# Patient Record
Sex: Male | Born: 1990 | Race: White | Hispanic: No | Marital: Single | Smoking: Current every day smoker
Health system: Southern US, Community
[De-identification: ages and names within clinical notes are randomized; demographics above are authoritative.]

## PROBLEM LIST (undated history)

## (undated) DIAGNOSIS — Z789 Other specified health status: Secondary | ICD-10-CM

## (undated) HISTORY — PX: JOINT REPLACEMENT: SHX530

---
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
## 2011-09-25 ENCOUNTER — Other Ambulatory Visit: Payer: Self-pay | Admitting: Otolaryngology

## 2011-09-25 LAB — COMPREHENSIVE METABOLIC PANEL
Albumin: 4.1 g/dL (ref 3.4–5.0)
Alkaline Phosphatase: 75 U/L (ref 50–136)
Anion Gap: 6 — ABNORMAL LOW (ref 7–16)
Calcium, Total: 9.2 mg/dL (ref 8.5–10.1)
Chloride: 106 mmol/L (ref 98–107)
Creatinine: 0.91 mg/dL (ref 0.60–1.30)
EGFR (African American): >60
EGFR (Non-African Amer.): >60
Osmolality: 280 (ref 275–301)
Potassium: 3.9 mmol/L (ref 3.5–5.1)
SGPT (ALT): 28 U/L
Total Protein: 7.2 g/dL (ref 6.4–8.2)

## 2011-09-25 LAB — CBC WITH DIFFERENTIAL/PLATELET
Basophil #: 0 10*3/uL (ref 0.0–0.1)
Basophil %: 0.8 %
Eosinophil #: 0.1 10*3/uL (ref 0.0–0.7)
HCT: 48.7 % (ref 40.0–52.0)
Lymphocyte #: 2 10*3/uL (ref 1.0–3.6)
MCH: 30 pg (ref 26.0–34.0)
MCHC: 33.3 g/dL (ref 32.0–36.0)
Monocyte #: 0.6 x10 3/mm (ref 0.2–1.0)
Monocyte %: 10.1 %
Neutrophil #: 2.9 10*3/uL (ref 1.4–6.5)
Neutrophil %: 52.7 %
RBC: 5.41 10*6/uL (ref 4.40–5.90)
RDW: 12.9 % (ref 11.5–14.5)

## 2011-09-27 ENCOUNTER — Ambulatory Visit: Payer: Self-pay | Admitting: Otolaryngology

## 2011-10-15 ENCOUNTER — Ambulatory Visit: Payer: Self-pay | Admitting: Family Medicine

## 2012-06-22 ENCOUNTER — Other Ambulatory Visit: Payer: Self-pay | Admitting: Orthopedic Surgery

## 2012-06-22 DIAGNOSIS — M25561 Pain in right knee: Secondary | ICD-10-CM

## 2012-06-24 ENCOUNTER — Ambulatory Visit
Admission: RE | Admit: 2012-06-24 | Discharge: 2012-06-24 | Disposition: A | Discharge status: Home / Self Care | Payer: BC Managed Care – PPO | Source: Ambulatory Visit | Attending: Orthopedic Surgery | Admitting: Orthopedic Surgery

## 2012-06-24 DIAGNOSIS — M25561 Pain in right knee: Secondary | ICD-10-CM

## 2012-06-24 DIAGNOSIS — M25461 Effusion, right knee: Secondary | ICD-10-CM

## 2013-10-03 ENCOUNTER — Other Ambulatory Visit: Payer: Self-pay | Admitting: Orthopedic Surgery

## 2013-10-03 DIAGNOSIS — R531 Weakness: Secondary | ICD-10-CM

## 2013-10-03 DIAGNOSIS — M25562 Pain in left knee: Secondary | ICD-10-CM

## 2013-10-03 DIAGNOSIS — R609 Edema, unspecified: Secondary | ICD-10-CM

## 2013-10-04 ENCOUNTER — Ambulatory Visit
Admission: RE | Admit: 2013-10-04 | Discharge: 2013-10-04 | Disposition: A | Discharge status: Home / Self Care | Payer: No Typology Code available for payment source | Source: Ambulatory Visit | Attending: Orthopedic Surgery | Admitting: Orthopedic Surgery

## 2013-10-04 ENCOUNTER — Other Ambulatory Visit: Payer: BC Managed Care – PPO

## 2013-10-04 DIAGNOSIS — R609 Edema, unspecified: Secondary | ICD-10-CM

## 2013-10-04 DIAGNOSIS — R531 Weakness: Secondary | ICD-10-CM

## 2013-10-04 DIAGNOSIS — M25562 Pain in left knee: Secondary | ICD-10-CM

## 2013-10-18 ENCOUNTER — Encounter (HOSPITAL_COMMUNITY): Payer: Self-pay | Admitting: Pharmacy Technician

## 2013-10-18 ENCOUNTER — Other Ambulatory Visit (HOSPITAL_COMMUNITY): Payer: Self-pay | Admitting: Orthopedic Surgery

## 2013-10-24 NOTE — Pre-Procedure Instructions (Signed)
Shane HammingDylan C Warren  10/24/2013   Your procedure is scheduled on:  10/27/13  [facility] AM.  Call this number if you have problems the morning of surgery: [phone]   Remember:   Do not eat food or drink liquids after midnight.   Take these medicines the morning of surgery with A SIP OF WATER: oxycodone   Do not wear jewelry, make-up or nail polish.  Do not wear lotions, powders, or perfumes. You may wear deodorant.  Do not shave 48 hours prior to surgery. Men may shave face and neck.  Do not bring valuables to the hospital.  [facility]                  for any belongings or valuables.               Contacts, dentures or bridgework may not be worn into surgery.  Leave suitcase in the car. After surgery it may be brought to your room.  For patients admitted to the hospital, discharge time is determined by your                treatment team.               Patients discharged the day of surgery will not be allowed to drive  home.  Name and phone number of your driver: family  Special Instructions: Shower using CHG 2 nights before surgery and the night before surgery.  If you shower the day of surgery use CHG.  Use special wash - you have one bottle of CHG for all showers.  You should use approximately 1/3 of the bottle for each shower.   Please read over the following fact sheets that you were given: Pain Booklet, Coughing and Deep Breathing and Surgical Site Infection Prevention

## 2013-10-25 ENCOUNTER — Encounter (HOSPITAL_COMMUNITY): Payer: Self-pay

## 2013-10-25 ENCOUNTER — Encounter (HOSPITAL_COMMUNITY)
Admission: RE | Admit: 2013-10-25 | Discharge: 2013-10-25 | Disposition: A | Discharge status: Home / Self Care | Payer: BC Managed Care – PPO | Source: Ambulatory Visit | Attending: Orthopedic Surgery | Admitting: Orthopedic Surgery

## 2013-10-25 HISTORY — DX: Other specified health status: Z78.9

## 2013-10-25 LAB — CBC
HEMATOCRIT: 49.2 % (ref 39.0–52.0)
HEMOGLOBIN: 17.4 g/dL — AB (ref 13.0–17.0)
MCH: 30.6 pg (ref 26.0–34.0)
MCHC: 35.4 g/dL (ref 30.0–36.0)
MCV: 86.5 fL (ref 78.0–100.0)
Platelets: 186 10*3/uL (ref 150–400)
RBC: 5.69 MIL/uL (ref 4.22–5.81)
RDW: 12.7 % (ref 11.5–15.5)
WBC: 7.6 10*3/uL (ref 4.0–10.5)

## 2013-10-27 ENCOUNTER — Encounter (HOSPITAL_COMMUNITY): Payer: Self-pay | Admitting: Anesthesiology

## 2013-10-27 ENCOUNTER — Ambulatory Visit (HOSPITAL_COMMUNITY): Payer: BC Managed Care – PPO

## 2013-10-27 ENCOUNTER — Encounter (HOSPITAL_COMMUNITY)
Admission: RE | Disposition: A | Discharge status: Home / Self Care | Payer: Self-pay | Source: Ambulatory Visit | Attending: Orthopedic Surgery

## 2013-10-27 ENCOUNTER — Ambulatory Visit (HOSPITAL_COMMUNITY): Payer: BC Managed Care – PPO | Admitting: Anesthesiology

## 2013-10-27 ENCOUNTER — Observation Stay (HOSPITAL_COMMUNITY)
Admission: RE | Admit: 2013-10-27 | Discharge: 2013-10-28 | Disposition: A | Discharge status: Home / Self Care | Payer: BC Managed Care – PPO | Source: Ambulatory Visit | Attending: Orthopedic Surgery | Admitting: Orthopedic Surgery

## 2013-10-27 ENCOUNTER — Encounter (HOSPITAL_COMMUNITY): Payer: BC Managed Care – PPO | Admitting: Anesthesiology

## 2013-10-27 DIAGNOSIS — F17200 Nicotine dependence, unspecified, uncomplicated: Secondary | ICD-10-CM | POA: Insufficient documentation

## 2013-10-27 DIAGNOSIS — X58XXXA Exposure to other specified factors, initial encounter: Secondary | ICD-10-CM | POA: Insufficient documentation

## 2013-10-27 DIAGNOSIS — S83006A Unspecified dislocation of unspecified patella, initial encounter: Principal | ICD-10-CM | POA: Insufficient documentation

## 2013-10-27 DIAGNOSIS — M25362 Other instability, left knee: Secondary | ICD-10-CM

## 2013-10-27 DIAGNOSIS — Y9367 Activity, basketball: Secondary | ICD-10-CM | POA: Insufficient documentation

## 2013-10-27 DIAGNOSIS — Z5333 Arthroscopic surgical procedure converted to open procedure: Secondary | ICD-10-CM | POA: Insufficient documentation

## 2013-10-27 DIAGNOSIS — M2240 Chondromalacia patellae, unspecified knee: Secondary | ICD-10-CM | POA: Insufficient documentation

## 2013-10-27 DIAGNOSIS — Z01812 Encounter for preprocedural laboratory examination: Secondary | ICD-10-CM | POA: Insufficient documentation

## 2013-10-27 DIAGNOSIS — M25369 Other instability, unspecified knee: Secondary | ICD-10-CM | POA: Diagnosis present

## 2013-10-27 DIAGNOSIS — M2340 Loose body in knee, unspecified knee: Secondary | ICD-10-CM | POA: Insufficient documentation

## 2013-10-27 HISTORY — PX: KNEE ARTHROSCOPY WITH MEDIAL PATELLAR FEMORAL LIGAMENT RECONSTRUCTION: SHX5652

## 2013-10-27 SURGERY — REPAIR, TENDON, PATELLAR, ARTHROSCOPIC
Anesthesia: General | Site: Knee | Laterality: Left

## 2013-10-27 MED ORDER — CEFAZOLIN SODIUM-DEXTROSE 2-3 GM-% IV SOLR
2.0000 g | INTRAVENOUS | Status: AC
  Administered 2013-10-27: 2 g via INTRAVENOUS

## 2013-10-27 MED ORDER — METHOCARBAMOL 1000 MG/10ML IJ SOLN
500.0000 mg | Freq: Four times a day (QID) | INTRAMUSCULAR | Status: DC | PRN
  Administered 2013-10-27: 500 mg via INTRAVENOUS
  Filled 2013-10-27: qty 5

## 2013-10-27 MED ORDER — BUPIVACAINE HCL (PF) 0.25 % IJ SOLN
INTRAMUSCULAR | Status: AC
  Filled 2013-10-27: qty 30

## 2013-10-27 MED ORDER — ROCURONIUM BROMIDE 50 MG/5ML IV SOLN
INTRAVENOUS | Status: AC
  Filled 2013-10-27: qty 1

## 2013-10-27 MED ORDER — HYDROMORPHONE HCL PF 1 MG/ML IJ SOLN
INTRAMUSCULAR | Status: AC
  Administered 2013-10-27: 0.5 mg via INTRAVENOUS
  Filled 2013-10-27: qty 1

## 2013-10-27 MED ORDER — EVICEL 2 ML EX KIT
PACK | CUTANEOUS | Status: AC
  Filled 2013-10-27: qty 1

## 2013-10-27 MED ORDER — LIDOCAINE HCL (CARDIAC) 20 MG/ML IV SOLN
INTRAVENOUS | Status: DC | PRN
  Administered 2013-10-27: 50 mg via INTRAVENOUS

## 2013-10-27 MED ORDER — SCOPOLAMINE 1 MG/3DAYS TD PT72
MEDICATED_PATCH | TRANSDERMAL | Status: DC | PRN
  Administered 2013-10-27: 1 via TRANSDERMAL

## 2013-10-27 MED ORDER — PROPOFOL 10 MG/ML IV BOLUS
INTRAVENOUS | Status: DC | PRN
  Administered 2013-10-27: 300 mg via INTRAVENOUS

## 2013-10-27 MED ORDER — ONDANSETRON HCL 4 MG/2ML IJ SOLN
INTRAMUSCULAR | Status: DC | PRN
  Administered 2013-10-27: 4 mg via INTRAVENOUS

## 2013-10-27 MED ORDER — MIDAZOLAM HCL 2 MG/2ML IJ SOLN
INTRAMUSCULAR | Status: AC
  Filled 2013-10-27: qty 2

## 2013-10-27 MED ORDER — HYDROMORPHONE HCL PF 1 MG/ML IJ SOLN
0.5000 mg | INTRAMUSCULAR | Status: DC | PRN
Start: 2013-10-27 — End: 2013-10-28
  Administered 2013-10-27: 1 mg via INTRAVENOUS
  Filled 2013-10-27: qty 1

## 2013-10-27 MED ORDER — LIDOCAINE HCL (CARDIAC) 20 MG/ML IV SOLN
INTRAVENOUS | Status: AC
  Filled 2013-10-27: qty 5

## 2013-10-27 MED ORDER — PROPOFOL 10 MG/ML IV BOLUS
INTRAVENOUS | Status: AC
  Filled 2013-10-27: qty 20

## 2013-10-27 MED ORDER — METHOCARBAMOL 500 MG PO TABS
500.0000 mg | ORAL_TABLET | Freq: Four times a day (QID) | ORAL | Status: DC | PRN
  Administered 2013-10-27 – 2013-10-28 (×3): 500 mg via ORAL
  Filled 2013-10-27 (×4): qty 1

## 2013-10-27 MED ORDER — ONDANSETRON HCL 4 MG/2ML IJ SOLN
INTRAMUSCULAR | Status: AC
  Filled 2013-10-27: qty 2

## 2013-10-27 MED ORDER — 0.9 % SODIUM CHLORIDE (POUR BTL) OPTIME
TOPICAL | Status: DC | PRN
  Administered 2013-10-27: 1000 mL

## 2013-10-27 MED ORDER — ASPIRIN 325 MG PO TABS
325.0000 mg | ORAL_TABLET | Freq: Every day | ORAL | Status: DC
  Administered 2013-10-27 – 2013-10-28 (×2): 325 mg via ORAL
  Filled 2013-10-27 (×2): qty 1

## 2013-10-27 MED ORDER — METOCLOPRAMIDE HCL 5 MG/ML IJ SOLN: 5.0000 mg | Freq: Three times a day (TID) | INTRAMUSCULAR | Status: DC | PRN

## 2013-10-27 MED ORDER — DEXAMETHASONE SODIUM PHOSPHATE 4 MG/ML IJ SOLN
INTRAMUSCULAR | Status: DC | PRN
  Administered 2013-10-27: 4 mg via INTRAVENOUS

## 2013-10-27 MED ORDER — FENTANYL CITRATE 0.05 MG/ML IJ SOLN
INTRAMUSCULAR | Status: AC
  Filled 2013-10-27: qty 5

## 2013-10-27 MED ORDER — MORPHINE SULFATE 4 MG/ML IJ SOLN
INTRAMUSCULAR | Status: AC
  Filled 2013-10-27: qty 1

## 2013-10-27 MED ORDER — HYDROMORPHONE HCL PF 1 MG/ML IJ SOLN
INTRAMUSCULAR | Status: AC
  Filled 2013-10-27: qty 1

## 2013-10-27 MED ORDER — ARTIFICIAL TEARS OP OINT
TOPICAL_OINTMENT | OPHTHALMIC | Status: AC
  Filled 2013-10-27: qty 3.5

## 2013-10-27 MED ORDER — SCOPOLAMINE 1 MG/3DAYS TD PT72
MEDICATED_PATCH | TRANSDERMAL | Status: AC
  Filled 2013-10-27: qty 1

## 2013-10-27 MED ORDER — SUCCINYLCHOLINE CHLORIDE 20 MG/ML IJ SOLN
INTRAMUSCULAR | Status: AC
  Filled 2013-10-27: qty 1

## 2013-10-27 MED ORDER — CEFAZOLIN SODIUM-DEXTROSE 2-3 GM-% IV SOLR
INTRAVENOUS | Status: AC
  Filled 2013-10-27: qty 50

## 2013-10-27 MED ORDER — EPHEDRINE SULFATE 50 MG/ML IJ SOLN
INTRAMUSCULAR | Status: AC
  Filled 2013-10-27: qty 1

## 2013-10-27 MED ORDER — KETOROLAC TROMETHAMINE 30 MG/ML IJ SOLN
INTRAMUSCULAR | Status: AC
  Filled 2013-10-27: qty 1

## 2013-10-27 MED ORDER — OXYCODONE HCL 5 MG PO TABS
5.0000 mg | ORAL_TABLET | ORAL | Status: DC | PRN
Start: 2013-10-27 — End: 2013-10-28
  Administered 2013-10-27 – 2013-10-28 (×5): 10 mg via ORAL
  Filled 2013-10-27 (×5): qty 2

## 2013-10-27 MED ORDER — CHLORHEXIDINE GLUCONATE 4 % EX LIQD
60.0000 mL | Freq: Once | CUTANEOUS | Status: DC
  Filled 2013-10-27: qty 60

## 2013-10-27 MED ORDER — METOCLOPRAMIDE HCL 10 MG PO TABS: 5.0000 mg | ORAL_TABLET | Freq: Three times a day (TID) | ORAL | Status: DC | PRN

## 2013-10-27 MED ORDER — HYDROMORPHONE HCL PF 1 MG/ML IJ SOLN
0.2500 mg | INTRAMUSCULAR | Status: DC | PRN
  Administered 2013-10-27 (×5): 0.5 mg via INTRAVENOUS

## 2013-10-27 MED ORDER — LACTATED RINGERS IV SOLN
INTRAVENOUS | Status: DC | PRN
  Administered 2013-10-27 (×2): via INTRAVENOUS

## 2013-10-27 MED ORDER — DEXAMETHASONE SODIUM PHOSPHATE 4 MG/ML IJ SOLN
INTRAMUSCULAR | Status: AC
  Filled 2013-10-27: qty 1

## 2013-10-27 MED ORDER — SODIUM CHLORIDE 0.9 % IR SOLN
Status: DC | PRN
Start: 2013-10-27 — End: 2013-10-27
  Administered 2013-10-27 (×2): 3000 mL

## 2013-10-27 MED ORDER — KETOROLAC TROMETHAMINE 15 MG/ML IJ SOLN
15.0000 mg | Freq: Four times a day (QID) | INTRAMUSCULAR | Status: DC
  Administered 2013-10-27 – 2013-10-28 (×4): 15 mg via INTRAVENOUS
  Filled 2013-10-27 (×7): qty 1

## 2013-10-27 MED ORDER — POTASSIUM CHLORIDE IN NACL 20-0.9 MEQ/L-% IV SOLN
INTRAVENOUS | Status: AC
  Administered 2013-10-27 – 2013-10-28 (×2): via INTRAVENOUS
  Filled 2013-10-27 (×2): qty 1000

## 2013-10-27 MED ORDER — ONDANSETRON HCL 4 MG PO TABS: 4.0000 mg | ORAL_TABLET | Freq: Four times a day (QID) | ORAL | Status: DC | PRN

## 2013-10-27 MED ORDER — MORPHINE SULFATE 4 MG/ML IJ SOLN
INTRAMUSCULAR | Status: DC | PRN
  Administered 2013-10-27 (×2): 4 mg via SUBCUTANEOUS

## 2013-10-27 MED ORDER — KETOROLAC TROMETHAMINE 30 MG/ML IJ SOLN
INTRAMUSCULAR | Status: DC | PRN
  Administered 2013-10-27: 30 mg via INTRAVENOUS

## 2013-10-27 MED ORDER — BUPIVACAINE HCL (PF) 0.25 % IJ SOLN
INTRAMUSCULAR | Status: DC | PRN
  Administered 2013-10-27: 30 mL

## 2013-10-27 MED ORDER — CEFAZOLIN SODIUM-DEXTROSE 2-3 GM-% IV SOLR
2.0000 g | Freq: Four times a day (QID) | INTRAVENOUS | Status: AC
  Administered 2013-10-27 (×2): 2 g via INTRAVENOUS
  Filled 2013-10-27 (×2): qty 50

## 2013-10-27 MED ORDER — CLONIDINE HCL (ANALGESIA) 100 MCG/ML EP SOLN
EPIDURAL | Status: DC | PRN
Start: 2013-10-27 — End: 2013-10-27
  Administered 2013-10-27: 100 ug via INTRA_ARTICULAR

## 2013-10-27 MED ORDER — MIDAZOLAM HCL 5 MG/5ML IJ SOLN
INTRAMUSCULAR | Status: DC | PRN
  Administered 2013-10-27 (×2): 2 mg via INTRAVENOUS

## 2013-10-27 MED ORDER — STERILE WATER FOR INJECTION IJ SOLN
INTRAMUSCULAR | Status: AC
  Filled 2013-10-27: qty 10

## 2013-10-27 MED ORDER — OXYCODONE HCL 5 MG/5ML PO SOLN: 5.0000 mg | Freq: Once | ORAL | Status: AC | PRN

## 2013-10-27 MED ORDER — METOCLOPRAMIDE HCL 5 MG/ML IJ SOLN: 10.0000 mg | Freq: Once | INTRAMUSCULAR | Status: DC | PRN

## 2013-10-27 MED ORDER — OXYCODONE HCL 5 MG PO TABS
5.0000 mg | ORAL_TABLET | Freq: Once | ORAL | Status: AC | PRN
  Administered 2013-10-27: 5 mg via ORAL

## 2013-10-27 MED ORDER — OXYCODONE HCL 5 MG PO TABS
ORAL_TABLET | ORAL | Status: AC
  Administered 2013-10-27: 5 mg via ORAL
  Filled 2013-10-27: qty 1

## 2013-10-27 MED ORDER — FENTANYL CITRATE 0.05 MG/ML IJ SOLN
INTRAMUSCULAR | Status: DC | PRN
  Administered 2013-10-27 (×3): 50 ug via INTRAVENOUS
  Administered 2013-10-27: 25 ug via INTRAVENOUS
  Administered 2013-10-27: 50 ug via INTRAVENOUS
  Administered 2013-10-27 (×6): 25 ug via INTRAVENOUS
  Administered 2013-10-27 (×2): 50 ug via INTRAVENOUS
  Administered 2013-10-27: 25 ug via INTRAVENOUS

## 2013-10-27 MED ORDER — CHLORHEXIDINE GLUCONATE 4 % EX LIQD
60.0000 mL | Freq: Once | CUTANEOUS | Status: DC
Start: 2013-10-27 — End: 2013-10-27
  Filled 2013-10-27: qty 60

## 2013-10-27 MED ORDER — ONDANSETRON HCL 4 MG/2ML IJ SOLN
4.0000 mg | Freq: Four times a day (QID) | INTRAMUSCULAR | Status: DC | PRN
  Administered 2013-10-27 – 2013-10-28 (×2): 4 mg via INTRAVENOUS
  Filled 2013-10-27 (×2): qty 2

## 2013-10-27 SURGICAL SUPPLY — 80 items
BANDAGE ELASTIC 6 VELCRO ST LF (GAUZE/BANDAGES/DRESSINGS) ×3 IMPLANT
BANDAGE ESMARK 6X9 LF (GAUZE/BANDAGES/DRESSINGS) ×1 IMPLANT
BLADE CUDA 5.5 (BLADE) IMPLANT
BLADE GREAT WHITE 4.2 (BLADE) ×2 IMPLANT
BLADE GREAT WHITE 4.2MM (BLADE) ×1
BLADE SURG 10 STRL SS (BLADE) ×3 IMPLANT
BLADE SURG 15 STRL LF DISP TIS (BLADE) ×2 IMPLANT
BLADE SURG 15 STRL SS (BLADE) ×4
BNDG ESMARK 6X9 LF (GAUZE/BANDAGES/DRESSINGS) ×3
BUR OVAL 6.0 (BURR) IMPLANT
CLOSURE STERI-STRIP 1/2X4 (GAUZE/BANDAGES/DRESSINGS) ×2
CLSR STERI-STRIP ANTIMIC 1/2X4 (GAUZE/BANDAGES/DRESSINGS) ×4 IMPLANT
COVER SURGICAL LIGHT HANDLE (MISCELLANEOUS) ×3 IMPLANT
CUFF TOURNIQUET SINGLE 34IN LL (TOURNIQUET CUFF) ×3 IMPLANT
CUFF TOURNIQUET SINGLE 44IN (TOURNIQUET CUFF) IMPLANT
DECANTER SPIKE VIAL GLASS SM (MISCELLANEOUS) ×3 IMPLANT
DRAPE ARTHROSCOPY W/POUCH 114 (DRAPES) ×3 IMPLANT
DRAPE C-ARM 42X72 X-RAY (DRAPES) ×3 IMPLANT
DRAPE C-ARMOR (DRAPES) ×3 IMPLANT
DRAPE INCISE IOBAN 66X45 STRL (DRAPES) ×6 IMPLANT
DRAPE U-SHAPE 47X51 STRL (DRAPES) ×3 IMPLANT
ELECT REM PT RETURN 9FT ADLT (ELECTROSURGICAL) ×3
ELECTRODE REM PT RTRN 9FT ADLT (ELECTROSURGICAL) ×1 IMPLANT
EVACUATOR 1/8 PVC DRAIN (DRAIN) ×3 IMPLANT
GLOVE BIO SURGEON ST LM GN SZ9 (GLOVE) IMPLANT
GLOVE BIOGEL PI IND STRL 8 (GLOVE) ×1 IMPLANT
GLOVE BIOGEL PI IND STRL 9 (GLOVE) IMPLANT
GLOVE BIOGEL PI INDICATOR 8 (GLOVE) ×2
GLOVE BIOGEL PI INDICATOR 9 (GLOVE)
GLOVE SURG ORTHO 8.0 STRL STRW (GLOVE) ×3 IMPLANT
GOWN STRL REUS W/ TWL LRG LVL3 (GOWN DISPOSABLE) ×3 IMPLANT
GOWN STRL REUS W/ TWL XL LVL3 (GOWN DISPOSABLE) ×1 IMPLANT
GOWN STRL REUS W/TWL LRG LVL3 (GOWN DISPOSABLE) ×6
GOWN STRL REUS W/TWL XL LVL3 (GOWN DISPOSABLE) ×2
IMMOBILIZER KNEE 22 (SOFTGOODS) ×3 IMPLANT
KIT BASIN OR (CUSTOM PROCEDURE TRAY) ×3 IMPLANT
KIT BIO-TENODESIS 3X8 DISP (MISCELLANEOUS) ×2
KIT INSRT BABSR STRL DISP BTN (MISCELLANEOUS) ×1 IMPLANT
KIT ROOM TURNOVER OR (KITS) ×3 IMPLANT
KIT TRANSTIBIAL (DISPOSABLE) IMPLANT
MANIFOLD NEPTUNE II (INSTRUMENTS) ×3 IMPLANT
NEEDLE 18GX1X1/2 (RX/OR ONLY) (NEEDLE) ×3 IMPLANT
NS IRRIG 1000ML POUR BTL (IV SOLUTION) ×3 IMPLANT
PACK ARTHROSCOPY DSU (CUSTOM PROCEDURE TRAY) ×3 IMPLANT
PACK IMPLANT BIOCOMPOSITE MPFL (Orthopedic Implant) ×3 IMPLANT
PAD ABD 8X10 STRL (GAUZE/BANDAGES/DRESSINGS) ×3 IMPLANT
PAD ARMBOARD 7.5X6 YLW CONV (MISCELLANEOUS) ×6 IMPLANT
PADDING CAST COTTON 6X4 STRL (CAST SUPPLIES) ×3 IMPLANT
PASSER SUT SWANSON 36MM LOOP (INSTRUMENTS) ×3 IMPLANT
PENCIL BUTTON HOLSTER BLD 10FT (ELECTRODE) ×3 IMPLANT
PLUERISTICK MICROFX CASE (MISCELLANEOUS) ×3 IMPLANT
SET ARTHROSCOPY TUBING (MISCELLANEOUS) ×2
SET ARTHROSCOPY TUBING LN (MISCELLANEOUS) ×1 IMPLANT
SPONGE GAUZE 4X4 12PLY STER LF (GAUZE/BANDAGES/DRESSINGS) ×3 IMPLANT
SPONGE LAP 18X18 X RAY DECT (DISPOSABLE) ×9 IMPLANT
SPONGE LAP 4X18 X RAY DECT (DISPOSABLE) ×6 IMPLANT
SPONGE SCRUB IODOPHOR (GAUZE/BANDAGES/DRESSINGS) ×3 IMPLANT
SUCTION FRAZIER TIP 10 FR DISP (SUCTIONS) ×3 IMPLANT
SUT 2 FIBERLOOP 20 STRT BLUE (SUTURE) ×3
SUT ETHILON 3 0 PS 1 (SUTURE) ×3 IMPLANT
SUT FIBERWIRE #2 38 T-5 BLUE (SUTURE)
SUT PROLENE 3 0 PS 2 (SUTURE) ×12 IMPLANT
SUT VIC AB 0 CT1 27 (SUTURE) ×8
SUT VIC AB 0 CT1 27XBRD ANBCTR (SUTURE) ×4 IMPLANT
SUT VIC AB 1 CT1 27 (SUTURE) ×2
SUT VIC AB 1 CT1 27XBRD ANBCTR (SUTURE) ×1 IMPLANT
SUT VIC AB 2-0 CT1 27 (SUTURE) ×8
SUT VIC AB 2-0 CT1 TAPERPNT 27 (SUTURE) ×4 IMPLANT
SUTURE 2 FIBERLOOP 20 STRT BLU (SUTURE) ×1 IMPLANT
SUTURE FIBERWR #2 38 T-5 BLUE (SUTURE) IMPLANT
SYR 30ML LL (SYRINGE) ×3 IMPLANT
SYR BULB IRRIGATION 50ML (SYRINGE) ×3 IMPLANT
SYR TB 1ML LUER SLIP (SYRINGE) ×3 IMPLANT
THUMB TAB DISPOSABLE (MISCELLANEOUS) ×3 IMPLANT
TOWEL OR 17X24 6PK STRL BLUE (TOWEL DISPOSABLE) ×3 IMPLANT
TOWEL OR 17X26 10 PK STRL BLUE (TOWEL DISPOSABLE) ×3 IMPLANT
UNDERPAD 30X30 INCONTINENT (UNDERPADS AND DIAPERS) ×3 IMPLANT
WATER STERILE IRR 1000ML POUR (IV SOLUTION) ×3 IMPLANT
WRAP KNEE MAXI GEL POST OP (GAUZE/BANDAGES/DRESSINGS) ×3 IMPLANT
YANKAUER SUCT BULB TIP NO VENT (SUCTIONS) ×3 IMPLANT

## 2013-10-27 NOTE — Evaluation (Signed)
Physical Therapy Evaluation Patient Details Name: Shane Warren MRN: [id number] DOB: [birth year] Today's Date: 10/27/2013   History of Present Illness  23 y.o. male s/p KNEE ARTHROSCOPY WITH MEDIAL PATELLAR FEMORAL LIGAMENT RECONSTRUCTION REVISION, CHONDRAL DEFECT REPAIR  Clinical Impression  Patient is seen following the above procedure and presents with functional limitations due to the deficits listed below (see PT Problem List). Gait training and stair training limited by nausea this afternoon. Will follow up in AM for appropriate training prior to D/c. Pt reports numbness over left saphenous nerve distribution distal to knee. Patient will benefit from skilled PT to increase their independence and safety with mobility to allow discharge to the venue listed below.       Follow Up Recommendations Outpatient PT    Equipment Recommendations  Crutches    Recommendations for Other Services OT consult     Precautions / Restrictions Precautions Precautions: Knee Precaution Comments: reviewed knee precautions Required Braces or Orthoses: Knee Immobilizer - Left Restrictions Weight Bearing Restrictions: Yes LLE Weight Bearing: Weight bearing as tolerated      Mobility  Bed Mobility Overal bed mobility: Modified Independent             General bed mobility comments: No cues needed. Pt safely performs supine<>sit x2.  Transfers Overall transfer level: Needs assistance Equipment used: Crutches Transfers: Sit to/from Stand Sit to Stand: Min guard         General transfer comment: Min guard for safety with instruction for safe crutch placement during transition from sit<>stand.  Ambulation/Gait Ambulation/Gait assistance: Min guard Ambulation Distance (Feet): 5 Feet Assistive device: Crutches Gait Pattern/deviations: Step-to pattern (3 point gait.)   Gait velocity interpretation: Below normal speed for age/gender General Gait Details: No loss of balance. Demonstrates good  control of crutches. Pt with nausea, limiting ability to ambulate further. Educated on safe use of crutches.  Stairs            Wheelchair Mobility    Modified Rankin (Stroke Patients Only)       Balance Overall balance assessment: Needs assistance Sitting-balance support: No upper extremity supported;Feet supported Sitting balance-Leahy Scale: Good     Standing balance support: No upper extremity supported Standing balance-Leahy Scale: Fair                               Pertinent Vitals/Pain Pt reports nausea is greater than pain at this time Nurse notified Patient repositioned in chair for comfort.     Home Living Family/patient expects to be discharged to:: Private residence Living Arrangements: Parent Available Help at Discharge: Family;Available 24 hours/day Type of Home: House Home Access: Stairs to enter Entrance Stairs-Rails: None Entrance Stairs-Number of Steps: 5 Home Layout: Two level;Able to live on main level with bedroom/bathroom Home Equipment: Walker - 2 wheels      Prior Function Level of Independence: Independent               Hand Dominance   Dominant Hand: Right    Extremity/Trunk Assessment   Upper Extremity Assessment: Defer to OT evaluation           Lower Extremity Assessment: LLE deficits/detail   LLE Deficits / Details: decreased strength and ROM as expected post-op     Communication   Communication: No difficulties  Cognition Arousal/Alertness: Awake/alert Behavior During Therapy: WFL for tasks assessed/performed Overall Cognitive Status: Within Functional Limits for tasks assessed  General Comments General comments (skin integrity, edema, etc.): Decreased light touch sensation in left saphenous nerve distribution.Pt with severe nausea (-) emesis. Unable to participate in further gait or stair training this afternoon due to this. Nurse notified.    Exercises         Assessment/Plan    PT Assessment Patient needs continued PT services  PT Diagnosis Difficulty walking;Abnormality of gait;Acute pain   PT Problem List Decreased strength;Decreased range of motion;Decreased activity tolerance;Decreased balance;Decreased mobility;Decreased knowledge of use of DME;Impaired sensation;Pain  PT Treatment Interventions DME instruction;Gait training;Stair training;Functional mobility training;Therapeutic activities;Therapeutic exercise;Balance training;Neuromuscular re-education;Patient/family education;Modalities   PT Goals (Current goals can be found in the Care Plan section) Acute Rehab PT Goals Patient Stated Goal: none stated PT Goal Formulation: With patient Time For Goal Achievement: 11/03/13 Potential to Achieve Goals: Good    Frequency Min 5X/week   Barriers to discharge        Co-evaluation               End of Session Equipment Utilized During Treatment: Left knee immobilizer Activity Tolerance: Other (comment) (Limited by nausea) Patient left: in chair;with call bell/phone within reach;with family/visitor present Nurse Communication: Mobility status;Other (comment) (Nausea)    Functional Assessment Tool Used: Clinical observation Functional Limitation: Mobility: Walking and moving around Mobility: Walking and Moving Around Current Status [phone](G8978): At least 1 percent but less than 20 percent impaired, limited or restricted Mobility: Walking and Moving Around Goal Status [phone](G8979): At least 1 percent but less than 20 percent impaired, limited or restricted    Time: [phone] PT Time Calculation (min): 35 min   Charges:   PT Evaluation $Initial PT Evaluation Tier I: 1 Procedure PT Treatments $Therapeutic Activity: 8-22 mins $Self Care/Home Management: 8-22   PT G Codes:   Functional Assessment Tool Used: Clinical observation Functional Limitation: Mobility: Walking and moving around   [facility] [place],  [place] [phone]   [place] Mount[place], Lynzy Rawles S 10/27/2013, 5:11 PM

## 2013-10-27 NOTE — Progress Notes (Signed)
Orders for concent not signed.  Message left for Dr August Saucerean on his cell phone.

## 2013-10-27 NOTE — Brief Op Note (Signed)
10/27/2013  11:01 AM  PATIENT:  Denali C Douse  23 y.o. male  PRE-OPERATIVE DIAGNOSIS:  LEFT KNEE INSTABILITY, CHONDRAL DEFECT PATELLA  POST-OPERATIVE DIAGNOSIS:  LEFT KNEE INSTABILITY, CHONDRAL DEFECT PATELLA  PROCEDURE:  Procedure(s): KNEE ARTHROSCOPY WITH MEDIAL PATELLAR FEMORAL LIGAMENT RECONSTRUCTION REVISION, CHONDRAL DEFECT microfracture, removal of loose bodies  SURGEON:  Surgeon(s): Cammy CopaGregory Scott Keegan Bensch, MD  ASSISTANT: carla bethune  ANESTHESIA:   general  EBL: 50 ml    Total I/O In: 1500 [I.V.:1500] Out: 150 [Blood:150]  BLOOD ADMINISTERED: none  DRAINS: none   LOCAL MEDICATIONS USED: marcaine morphine clonidine  SPECIMEN:  No Specimen  COUNTS:  YES  TOURNIQUET:   Total Tourniquet Time Documented: Thigh (Left) - 125 minutes Total: Thigh (Left) - 125 minutes   DICTATION: .Other Dictation: Dictation Number [id number]  PLAN OF CARE: Admit for overnight observation  PATIENT DISPOSITION:  PACU - hemodynamically stable

## 2013-10-27 NOTE — H&P (Signed)
Shane Warren is an 23 y.o. male.   Chief Complaint: Left knee pain HPI: Shane Warren is a 23 year old patient is now about 7 years out from left knee medial patellofemoral ligament repair. He had patellar instability at that time and was doing well until a recent basketball injury. Essentially has repeat dislocation as a result of that injury several weeks ago. MRI scan consistent with disruption of the medial patellofemoral ligament repair he also has chondral injury on the patella. Reports continued pain and instability despite bracing and nonoperative management and presents now for operative management of the problem. He denies a family history of DVT or pulmonary embolism  Past Medical History  Diagnosis Date  . Medical history non-contributory     Past Surgical History  Procedure Laterality Date  . Joint replacement      bil knee surgery    No family history on file. Social History:  reports that he has been smoking Cigars.  He does not have any smokeless tobacco history on file. He reports that he drinks alcohol. He reports that he uses illicit drugs.  Allergies: No Known Allergies  Medications Prior to Admission  Medication Sig Dispense Refill  . oxyCODONE-acetaminophen (PERCOCET/ROXICET) 5-325 MG per tablet Take 1 tablet by mouth every 4 (four) hours as needed for severe pain.        Results for orders placed during the hospital encounter of 10/25/13 (from the past 48 hour(s))  CBC     Status: Abnormal   Collection Time    10/25/13  8:23 AM      Result Value Ref Range   WBC 7.6  4.0 - 10.5 K/uL   RBC 5.69  4.22 - 5.81 MIL/uL   Hemoglobin 17.4 (*) 13.0 - 17.0 g/dL   HCT 16.1  09.6 - 04.5 %   MCV 86.5  78.0 - 100.0 fL   MCH 30.6  26.0 - 34.0 pg   MCHC 35.4  30.0 - 36.0 g/dL   RDW 40.9  81.1 - 91.4 %   Platelets 186  150 - 400 K/uL   No results found.  Review of Systems  Constitutional: Negative.   HENT: Negative.   Eyes: Negative.   Respiratory: Negative.    Cardiovascular: Negative.   Gastrointestinal: Negative.   Genitourinary: Negative.   Musculoskeletal: Positive for joint pain.  Skin: Negative.   Neurological: Negative.   Endo/Heme/Allergies: Negative.   Psychiatric/Behavioral: Negative.     Blood pressure 95/46, pulse 77, temperature 97.6 F (36.4 C), temperature source Oral, resp. rate 15, height 5\' 11"  (1.803 m), weight 99.026 kg (218 lb 5 oz), SpO2 100.00%. Physical Exam  Constitutional: He appears well-developed.  HENT:  Head: Normocephalic.  Eyes: Pupils are equal, round, and reactive to light.  Neck: Normal range of motion.  Cardiovascular: Normal rate.   Respiratory: Effort normal.  Neurological: He is alert.  Skin: Skin is warm.  Psychiatric: He has a normal mood and affect.   examination the left knee demonstrates well-healed surgical incision on the medial aspect of the medial epicondyles on the medial aspect of the patella the anterior cruciate ligament PCL intact patella femoral crepitus is present collateral cruciates are stable range of motion is intact patellar apprehension is positive on the left-hand side as about 1 patella with lateral instability.  Assessment/Plan Impression is recurrent left knee lateral patellar instability status post medial patellofemoral ligament repair plan medial patellofemoral ligament reconstruction using hamstring autograft along with allograft chondral implantation into the defect of the patella  risk and benefits discussed with patient we will and to knee stiffness failure of the biological resurfacing of the patella to take place as well as potential for recurrent instability or overt pressuring of the patellofemoral joint risk and benefits are discussed all questions answered.  DEAN,GREGORY SCOTT 10/27/2013, 7:22 AM

## 2013-10-27 NOTE — Anesthesia Procedure Notes (Signed)
Procedure Name: LMA Insertion Date/Time: 10/27/2013 7:39 AM Performed by: Margaree MackintoshYACOUB, [name] Pre-anesthesia Checklist: Patient identified, Emergency Drugs available, Suction available, Patient being monitored and Timeout performed Patient Re-evaluated:Patient Re-evaluated prior to inductionOxygen Delivery Method: Circle system utilized Preoxygenation: Pre-oxygenation with 100% oxygen Intubation Type: IV induction LMA: LMA inserted LMA Size: 5.0 Number of attempts: 1 Placement Confirmation: positive ETCO2 and breath sounds checked- equal and bilateral Tube secured with: Tape Dental Injury: Teeth and Oropharynx as per pre-operative assessment

## 2013-10-27 NOTE — Anesthesia Postprocedure Evaluation (Signed)
Anesthesia Post Note  Patient: Shane Warren  Procedure(s) Performed: Procedure(s) (LRB): KNEE ARTHROSCOPY WITH MEDIAL PATELLAR FEMORAL LIGAMENT RECONSTRUCTION REVISION, CHONDRAL DEFECT REPAIR (Left)  Anesthesia type: MAC  Patient location: PACU  Post pain: Pain level controlled  Post assessment: Patient's Cardiovascular Status Stable  Last Vitals:  Filed Vitals:   10/27/13 1300  BP: 122/62  Pulse: 88  Temp: 36.8 C  Resp: 11    Post vital signs: Reviewed and stable  Level of consciousness: alert  Complications: No apparent anesthesia complications

## 2013-10-27 NOTE — Transfer of Care (Signed)
Immediate Anesthesia Transfer of Care Note  Patient: Shane Warren  Procedure(s) Performed: Procedure(s): KNEE ARTHROSCOPY WITH MEDIAL PATELLAR FEMORAL LIGAMENT RECONSTRUCTION REVISION, CHONDRAL DEFECT REPAIR (Left)  Patient Location: PACU  Anesthesia Type:General  Level of Consciousness: awake, alert  and oriented  Airway & Oxygen Therapy: Patient Spontanous Breathing and Patient connected to nasal cannula oxygen  Post-op Assessment: Report given to PACU RN and Post -op Vital signs reviewed and stable  Post vital signs: Reviewed and stable  Complications: No apparent anesthesia complications

## 2013-10-27 NOTE — Progress Notes (Signed)
Orthopedic Tech Progress Note Patient Details:  Shane Warren 11/09/1990 [id number] CPM applied with appropriate settings. OHF applied to bed. CPM Left Knee CPM Left Knee: On Left Knee Flexion (Degrees): 50 Left Knee Extension (Degrees): 0   [name] 10/27/2013, 12:22 PM

## 2013-10-27 NOTE — Anesthesia Preprocedure Evaluation (Signed)
Anesthesia Evaluation  Patient identified by MRN, date of birth, ID band Patient awake    Reviewed: Allergy & Precautions, H&P , NPO status , Patient's Chart, lab work & pertinent test results, reviewed documented beta blocker date and time   Airway Mallampati: II TM Distance: >3 FB Neck ROM: full    Dental   Pulmonary Current Smoker,  breath sounds clear to auscultation        Cardiovascular negative cardio ROS  Rhythm:regular     Neuro/Psych negative neurological ROS  negative psych ROS   GI/Hepatic negative GI ROS, Neg liver ROS,   Endo/Other  negative endocrine ROS  Renal/GU negative Renal ROS  negative genitourinary   Musculoskeletal   Abdominal   Peds  Hematology negative hematology ROS (+)   Anesthesia Other Findings See surgeon's H&P   Reproductive/Obstetrics negative OB ROS                           Anesthesia Physical Anesthesia Plan  ASA: II  Anesthesia Plan: General   Post-op Pain Management:    Induction: Intravenous  Airway Management Planned: LMA  Additional Equipment:   Intra-op Plan:   Post-operative Plan:   Informed Consent: I have reviewed the patients History and Physical, chart, labs and discussed the procedure including the risks, benefits and alternatives for the proposed anesthesia with the patient or authorized representative who has indicated his/her understanding and acceptance.   Dental Advisory Given  Plan Discussed with: CRNA and Surgeon  Anesthesia Plan Comments:         Anesthesia Quick Evaluation  

## 2013-10-28 ENCOUNTER — Encounter (HOSPITAL_COMMUNITY): Payer: Self-pay | Admitting: Orthopedic Surgery

## 2013-10-28 MED ORDER — METHOCARBAMOL 500 MG PO TABS: 500.0000 mg | ORAL_TABLET | Freq: Four times a day (QID) | ORAL | Status: AC | PRN

## 2013-10-28 MED ORDER — OXYCODONE HCL 5 MG PO TABS: 5.0000 mg | ORAL_TABLET | ORAL | Status: AC | PRN

## 2013-10-28 MED ORDER — ASPIRIN 325 MG PO TABS: 325.0000 mg | ORAL_TABLET | Freq: Every day | ORAL | Status: AC

## 2013-10-28 NOTE — Op Note (Signed)
[name], Warren                 ACCOUNT NO.:  [id number]  MEDICAL RECORD NO.:  [id number]  LOCATION:  [id number]                        FACILITY:  [facility]  PHYSICIAN:  Burnard BuntingG. Scott Jerrell Hart, M.D.    DATE OF BIRTH:  11-14-1990  DATE OF PROCEDURE: DATE OF DISCHARGE:                              OPERATIVE REPORT   PREOPERATIVE DIAGNOSES:  Left knee patellar instability and chondral defect, medial facet.  POSTOPERATIVE DIAGNOSES:  Left knee patellar instability and chondral defect, medial facet plus multiple loose bodies in the joint.  PROCEDURE:  Left knee arthroscopy, removal of loose bodies, microfracture, medial facet patella, chondral defect, which was a noncontained defect along with open medial patellofemoral ligament reconstruction using gracilis hamstring autograft.  SURGEON:  Burnard BuntingG. Scott Deklynn Charlet, M.D.  ASSISTANT:  Patrick Jupiterarla Bethune, RNFA.  INDICATIONS:  Shane Warren is a patient with recurrent patellar instability and pain who presents for operative management after explanation of risks and benefits for patellar instability.  OPERATIVE FINDINGS:  Examination under anesthesia, range of motion 0-135 of flexion with stability varus and valgus stress.  ACL, PCL intact.  No posterolateral  rotatory instability is noted.  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  No corresponding trochlear changes.  The medial compartment, articular cartilage, and meniscus intact.  Lateral compartment, articular cartilage, and meniscus intact.  PROCEDURE IN DETAIL:  The patient was brought to the operating room where general anesthesia was induced.  Preoperative antibiotics were administered.  Time-out was called.  Left leg was prepped with Hibiclens and saline draped in sterile manner.  It should be noted on exam under anesthesia as well that the  patella was easily dislocatable laterally with about 2 inches of lateral translation.  Following sterile prepping with DuraPrep solution draping in a sterile manner.  Collier Flowersoban was used to cover the operative field.  Leg was elevated and exsanguinated with Esmarch wrap.  Tourniquet was inflated.  Total time 125 minutes at 300 mmHg.  An incision was made over the pes bursa tendon.  Skin and subcutaneous tissue sharply divided.  Gracilis tendon was then harvested separate from the sartorius and semi-tendinosis.  Prepared on the back table by Patrick Jupiterarla Bethune to a length of 240 with #2 FiberWire sutures in each end.  This incision was then irrigated and packed with moist sponge.  An anterior, inferior, and lateral portal was then established. Diagnostic arthroscopy demonstrated significant wear on the medial facet of the patella with loose chondral fragments and grade 4 chondromalacia present.  This is consistent with the MRI scan.  The patient also had a small chondral defect measuring about 5 x 6 mm on the lateral femoral condyle.  At this time, the anterior, inferior, medial portal was established.  Joint was inspected.  Medial compartment, lateral compartment, articular cartilage, and menisci were intact.  Loose bodies present in the lateral compartment, which were removed.  ACL and PCL intact.  No real trochlear wear and tear noted yet.  Although there was some very early grade 1 traversing within the trochlea,  but no loose flaps.  At this time, microfracture performed on both lateral femoral condyle, and medial facet patella lesions.  This was a noncontained lesion on the patella as well as on the lateral femoral condyle, and therefore biologic chondrocyte implantation was not possible.  At this time, prior to microfracture, debriding of the flaps was performed on both areas.  Microfracture was performed with the [employer] nano awl. Following this microfracture, knee joint was thoroughly  irrigated, loose bodies removed, instruments were removed from the portals, old incision along the medial aspect of the patella was utilized.  Skin and subcutaneous tissue were sharply divided.  A plane was then again developed between layers 2 and layers 3 down to the medial epicondyle region.  Separate incision made in this area where the prior incision was.  The facet was cleaned of soft tissue.  The isometric point was then determined using fluoroscopy.  Guide pin was then placed at this location where the native attachment of the medial patellofemoral ligament was.  This was then checked under fluoroscopy and found to be completely accurate.  Guide pin was drilled.  5-mm hole was then drilled across to the lateral cortex of the femur.  At this time, under fluoroscopic guidance, two drill holes were placed in the upper third and middle third of the patella.  Guide pins placed and then 5 mm swivel locks used to drill approximately 20 mm hole.  At this time, graft length was measured and found to be appropriate length for the reconstruction.  The 2 tendon ends were then placed using swivel lock into the patella.  Good fixation achieved.  The tendon was then taken around with the knee in about 20 degrees of flexion and with the patella against the lateral aspect of the trochlea.  The tendon was tightened down with an interference screw.  This was a bioabsorbable interference screw, which gave good fixation.  Knee was taken through range of motion.  Patella had about 3/4 of an inch mobility laterally in both extension and 20 degrees of flexion with good endpoint.  There is definitely an improvement, but not over constraint.  Knee was taken through a range of motion, found to have smooth range of motion after multiple cycles.  At this time, tourniquet was released.  All incisions were thoroughly irrigated including the knee joint.  The incisions were then closed using interrupted inverted  0-Vicryl suture, 2-0 Vicryl suture, and a running 3-0 pullout Prolene suture.  Marcaine and clonidine were injected into the knee.  Plan is to start weightbearing as tolerated with a CPM machine.  The patient tolerated the procedure well without immediate complications.     Burnard Bunting, M.D.     GSD/MEDQ  D:  10/27/2013  T:  10/28/2013  Job:  [id number]

## 2013-10-28 NOTE — Progress Notes (Signed)
Subjective: Pt stable - doing well - was in cpm last pm   Objective: Vital signs in last 24 hours: Temp:  [97.2 F (36.2 C)-98.3 F (36.8 C)] 97.6 F (36.4 C) (07/10 0441) Pulse Rate:  [57-97] 68 (07/10 0441) Resp:  [5-22] 16 (07/10 0441) BP: (118-133)/(41-76) 120/65 mmHg (07/10 0441) SpO2:  [91 %-100 %] 99 % (07/10 0441)  Intake/Output from previous day: 07/09 0701 - 07/10 0700 In: 2180 [P.O.:480; I.V.:1700] Out: 1050 [Urine:800; Blood:250] Intake/Output this shift:    Exam:  Neurovascular intact Sensation intact distally Intact pulses distally Dorsiflexion/Plantar flexion intact  Labs:  Recent Labs  10/25/13 0823  HGB 17.4*    Recent Labs  10/25/13 0823  WBC 7.6  RBC 5.69  HCT 49.2  PLT 186   No results found for this basename: NA, K, CL, CO2, BUN, CREATININE, GLUCOSE, CALCIUM,  in the last 72 hours No results found for this basename: LABPT, INR,  in the last 72 hours  Assessment/Plan: Plan dc today - will need dressing change with mepilex before dc - ok for wbat in knee immobilizer   DEAN,GREGORY SCOTT 10/28/2013, 7:23 AM

## 2013-10-28 NOTE — Discharge Summary (Signed)
Physician Discharge Summary  Patient ID: Shane Warren MRN: [id number] DOB/AGE: [birth year] 23 y.o.  Admit date: 10/27/2013 Discharge date: 10/28/2013  Admission Diagnoses:  Active Problems:   Patellar instability   Discharge Diagnoses:  Same  Surgeries: Procedure(s): KNEE ARTHROSCOPY WITH MEDIAL PATELLAR FEMORAL LIGAMENT RECONSTRUCTION REVISION, CHONDRAL DEFECT REPAIR on 10/27/2013   Consultants:    Discharged Condition: Stable  Hospital Course: Shane Warren is an 23 y.o. male who was admitted 10/27/2013 with a chief complaint of knee pain, and found to have a diagnosis of patella instability.  They were brought to the operating room on 10/27/2013 and underwent the above named procedures.  Did well with PT and could mobilize prior to dc.  Antibiotics given:  Anti-infectives   Start     Dose/Rate Route Frequency Ordered Stop   10/27/13 1400  ceFAZolin (ANCEF) IVPB 2 g/50 mL premix     2 g 100 mL/hr over 30 Minutes Intravenous Every 6 hours 10/27/13 1321 10/27/13 2137   10/27/13 0704  ceFAZolin (ANCEF) 2-3 GM-% IVPB SOLR    Comments:  Girtha Hake  : cabinet override      10/27/13 0704 10/27/13 1914   10/27/13 0703  ceFAZolin (ANCEF) IVPB 2 g/50 mL premix     2 g 100 mL/hr over 30 Minutes Intravenous On call to O.R. 10/27/13 0703 10/27/13 0745    .  Recent vital signs:  Filed Vitals:   10/28/13 0441  BP: 120/65  Pulse: 68  Temp: 97.6 F (36.4 C)  Resp: 16    Recent laboratory studies:  Results for orders placed during the hospital encounter of 10/25/13  CBC      Result Value Ref Range   WBC 7.6  4.0 - 10.5 K/uL   RBC 5.69  4.22 - 5.81 MIL/uL   Hemoglobin 17.4 (*) 13.0 - 17.0 g/dL   HCT 40.9  81.1 - 91.4 %   MCV 86.5  78.0 - 100.0 fL   MCH 30.6  26.0 - 34.0 pg   MCHC 35.4  30.0 - 36.0 g/dL   RDW 78.2  95.6 - 21.3 %   Platelets 186  150 - 400 K/uL    Discharge Medications:     Medication List    STOP taking these medications        oxyCODONE-acetaminophen 5-325 MG per tablet  Commonly known as:  PERCOCET/ROXICET      TAKE these medications       aspirin 325 MG tablet  Take 1 tablet (325 mg total) by mouth daily.     methocarbamol 500 MG tablet  Commonly known as:  ROBAXIN  Take 1 tablet (500 mg total) by mouth every 6 (six) hours as needed for muscle spasms.     oxyCODONE 5 MG immediate release tablet  Commonly known as:  Oxy IR/ROXICODONE  Take 1-2 tablets (5-10 mg total) by mouth every 3 (three) hours as needed for breakthrough pain.        Diagnostic Studies: Mr Knee Left  Wo Contrast  10/04/2013   CLINICAL DATA:  Left knee pain anteriorly and medially.  EXAM: MRI OF THE LEFT KNEE WITHOUT CONTRAST  TECHNIQUE: Multiplanar, multisequence MR imaging of the knee was performed. No intravenous contrast was administered.  COMPARISON:  04/10/2008  FINDINGS: MENISCI  Medial meniscus:  Intact.  Lateral meniscus:  Intact.  LIGAMENTS  Cruciates:  Intact ACL and PCL.  Collaterals: Medial collateral ligament is intact. Lateral collateral ligament complex is intact.  CARTILAGE  Patellofemoral: Full-thickness  cartilage loss of the medial patellar facet and patellar apex with severe marrow edema within the medial pole of the patella. There is a small cartilage defect along the peripheral most aspect of the lateral trochlea.  Medial: There is a small area of full-thickness cartilage loss of the peripheral aspect of the medial femoral condyle with subchondral reactive marrow edema.  Lateral: Partial thickness cartilage loss of the anterior lateral femoral condyle.  Joint:  Small joint effusion.  Mild edema in Hoffa's fat.  Popliteal Fossa: Small ruptured Baker cyst with fluid tracking along the superficial aspect of the gastrocnemius muscle. Intact popliteus tendon.  Extensor Mechanism: There is evidence of prior MPFL repair with surgical anchors in the medial femoral condyle and medial pole of the patella. There is a complete tear of the  MPFL and medial retinaculum with severe thickening and abnormal signal throughout the course most significantly along the femoral attachment. There is relative lateralization of the patellar tendon insertion in relation to the trochlear groove. The patellar tendon and quadriceps tendon are intact. The VMO appears intact.  Bones: Marrow edema involving the medial half of the patella anterolateral femoral condyle.  IMPRESSION: 1. Findings consistent with acute-subacute transient patellar dislocation with severe marrow edema in the medial half of the patella and anterolateral femoral condyle. Prior MPFL repair with a complete tear. Full-thickness cartilage loss of the medial patellar facet and patellar apex. Small cartilage defect along the peripheral most aspect of the lateral trochlea. 2. Small ruptured Baker cyst.   Electronically Signed   By: Elige KoHetal  Patel   On: 10/04/2013 10:00   Dg C-arm 1-60 Min  10/27/2013   CLINICAL DATA:  Left Knee Surgery  EXAM: DG C-ARM 61-120 MIN; LEFT KNEE - 3 VIEW  COMPARISON:  10/04/2013  FINDINGS: Images demonstrate two views of the left knee with instrumentation in place.  IMPRESSION: Intraoperative imaging for knee surgery.   Electronically Signed   By: Maryclare BeanArt  Hoss M.D.   On: 10/27/2013 16:58   Dg Knee 2 Views Left  10/27/2013   CLINICAL DATA:  Left Knee Surgery  EXAM: DG C-ARM 61-120 MIN; LEFT KNEE - 3 VIEW  COMPARISON:  10/04/2013  FINDINGS: Images demonstrate two views of the left knee with instrumentation in place.  IMPRESSION: Intraoperative imaging for knee surgery.   Electronically Signed   By: Maryclare BeanArt  Hoss M.D.   On: 10/27/2013 16:58    Disposition: Final discharge disposition not confirmed      Discharge Instructions   Call MD / Call 911    Complete by:  As directed   If you experience chest pain or shortness of breath, CALL 911 and be transported to the hospital emergency room.  If you develope a fever above 101 F, pus (white drainage) or increased drainage or  redness at the wound, or calf pain, call your surgeon's office.     Constipation Prevention    Complete by:  As directed   Drink plenty of fluids.  Prune juice may be helpful.  You may use a stool softener, such as Colace (over the counter) 100 mg twice a day.  Use MiraLax (over the counter) for constipation as needed.     Diet - low sodium heart healthy    Complete by:  As directed      Discharge instructions    Complete by:  As directed   Weight bearing as tolerated with crutches with left knee in knee immobilizer CPM 1 hour 3 times a day - increase  daily Keep incisions dry     Increase activity slowly as tolerated    Complete by:  As directed               Signed: Dov Dill SCOTT 10/28/2013, 7:28 AM

## 2013-10-28 NOTE — Progress Notes (Signed)
UR Completed Khadeeja Elden Graves-Bigelow, RN,BSN [phone]  

## 2013-10-28 NOTE — Progress Notes (Signed)
Physical Therapy Treatment Patient Details Name: Shane Warren C [place]rad[place] MRN: [id number] DOB: 08/01/1990 Today's Date: 10/28/2013    History of Present Illness 23 y.o. male s/p KNEE ARTHROSCOPY WITH MEDIAL PATELLAR FEMORAL LIGAMENT RECONSTRUCTION REVISION, CHONDRAL DEFECT REPAIR    PT Comments    *Pt tolerated increased activity today, he walked 110' with crutches, completed stair training, and did LLE exercises. Tolerance of exercises limited by pain. He is only able to tolerate 25* of L knee flexion AAROM. Pt was premedicated for pain. Ice applied to L knee. REady to DC home from PT standpoint. **  Follow Up Recommendations  Outpatient PT     Equipment Recommendations  Crutches    Recommendations for Other Services OT consult     Precautions / Restrictions Precautions Precautions: Knee Precaution Comments: reviewed knee precautions Required Braces or Orthoses: Knee Immobilizer - Left Restrictions Weight Bearing Restrictions: Yes LLE Weight Bearing: Weight bearing as tolerated    Mobility  Bed Mobility Overal bed mobility: Modified Independent             General bed mobility comments: No cues needed. Pt safely performs supine<>sit x2., self assists LLE with RLE.  Transfers Overall transfer level: Modified independent Equipment used: Crutches Transfers: Sit to/from Stand Sit to Stand: Modified independent (Device/Increase time)         General transfer comment: Supervision for safety due to light headedness and nausea. Pt with good mobility and use of crutches.   Ambulation/Gait Ambulation/Gait assistance: Modified independent (Device/Increase time) Ambulation Distance (Feet): 110 Feet Assistive device: Crutches Gait Pattern/deviations: Step-through pattern   Gait velocity interpretation: <1.8 ft/sec, indicative of risk for recurrent falls General Gait Details: No loss of balance. Demonstrates good control of crutches. Educated pt to WB through hands rather than  through armpits when using crutches.    Stairs Stairs: Yes Stairs assistance: Supervision Stair Management: No rails;With crutches;Forwards;Step to pattern Number of Stairs: 5 General stair comments: cues for sequencing, steady, no LOB  Wheelchair Mobility    Modified Rankin (Stroke Patients Only)       Balance                                    Cognition Arousal/Alertness: Awake/alert Behavior During Therapy: WFL for tasks assessed/performed Overall Cognitive Status: Within Functional Limits for tasks assessed                      Exercises General Exercises - Lower Extremity Ankle Circles/Pumps: AROM;Both;10 reps;Supine Quad Sets: AROM;Left;5 reps;Supine Short Arc Quad: AROM;Left;5 reps Heel Slides: AAROM;Left;10 reps;Supine Hip ABduction/ADduction: AAROM;Left;10 reps;Supine Straight Leg Raises: AAROM;Left;5 reps;Supine  Tolerance of L knee flexion limited by pain. AAROM L knee flexion 25*.     General Comments        Pertinent Vitals/Pain *8/10 L knee Premedicated, ice applied, RN notified**    Home Living Family/patient expects to be discharged to:: Private residence Living Arrangements: Parent Available Help at Discharge: Family;Available 24 hours/day Type of Home: House Home Access: Stairs to enter Entrance Stairs-Rails: None Home Layout: Two level;Able to live on main level with bedroom/bathroom Home Equipment: Walker - 2 wheels      Prior Function Level of Independence: Independent      Comments: pt with hx of knee surgeries and familiar with compensatory techniques   PT Goals (current goals can now be found in the care plan section) Acute Rehab PT Goals Patient  Stated Goal: return to weight lifting PT Goal Formulation: With patient/family Time For Goal Achievement: 11/03/13 Potential to Achieve Goals: Good Progress towards PT goals: Progressing toward goals    Frequency  Min 5X/week    PT Plan Current plan  remains appropriate    Co-evaluation             End of Session Equipment Utilized During Treatment: Left knee immobilizer Activity Tolerance: Patient limited by pain Patient left: with call bell/phone within reach;with family/visitor present;in bed     Time: 1610-9604 PT Time Calculation (min): 29 min  Charges:  $Gait Training: 8-22 mins $Therapeutic Exercise: 8-22 mins                    G Codes:      Tamala Ser 10/28/2013, 11:04 AM [phone]

## 2013-10-28 NOTE — Progress Notes (Signed)
Occupational Therapy Evaluation and Discharge Patient Details Name: Shane Warren MRN: [id number] DOB: 07-08-1990 Today's Date: 10/28/2013    History of Present Illness 23 y.o. male s/p KNEE ARTHROSCOPY WITH MEDIAL PATELLAR FEMORAL LIGAMENT RECONSTRUCTION REVISION, CHONDRAL DEFECT REPAIR   Clinical Impression   PTA pt lived at home and was independent with ADLs and functional mobility. Pt has hx of knee surgeries due to sports related injuries. Pt moving at Supervision level with use of crutches and practiced getting on/off toilet. Pt will have 24/7 assistance at home. No further acute OT needs.     Follow Up Recommendations  No OT follow up;Supervision - Intermittent    Equipment Recommendations  None recommended by OT       Precautions / Restrictions Precautions Precautions: Knee Precaution Comments: reviewed knee precautions Required Braces or Orthoses: Knee Immobilizer - Left Restrictions Weight Bearing Restrictions: Yes LLE Weight Bearing: Weight bearing as tolerated      Mobility Bed Mobility Overal bed mobility: Modified Independent                Transfers Overall transfer level: Needs assistance Equipment used: Crutches Transfers: Sit to/from Stand Sit to Stand: Supervision         General transfer comment: Supervision for safety due to light headedness and nausea. Pt with good mobility and use of crutches.          ADL Overall ADL's : Needs assistance/impaired Eating/Feeding: Independent;Sitting   Grooming: Supervision/safety;Standing   Upper Body Bathing: Set up;Sitting   Lower Body Bathing: Minimal assistance;Sit to/from stand Lower Body Bathing Details (indicate cue type and reason): discussed use of adaptive equipment (long handled sponge) to assist with LB bathing.  Upper Body Dressing : Sitting;Set up   Lower Body Dressing: Minimal assistance;Sit to/from stand Lower Body Dressing Details (indicate cue type and reason): pt needs assist  to don/doff socks and shoes on LLE, able to thread LLE through shorts. Educated pt in compensatory techniques for LB dressing and use of leg lifter to assist with ADLs.  Toilet Transfer: Supervision/safety;Ambulation;Regular Toilet (crutches)   Toileting- Clothing Manipulation and Hygiene: Supervision/safety;Sit to/from stand   Tub/ Shower Transfer: Walk-in shower;Supervision/safety (crutches)   Functional mobility during ADLs: Supervision/safety (crutches) General ADL Comments: Pt moving well and improved nausea today. Able to thread LLE through shorts. Educated pt on use of leg lifter to assist with bed mobility and ADLs. Educated pt and family in compenstory techniques for LB ADLs and for safety in the shower.      Vision  Pt reports no change from baseline.  No apparent visual deficits.                  Perception Perception Perception Tested?: No   Praxis Praxis Praxis tested?: Within functional limits    Pertinent Vitals/Pain Pt reports pain in L knee at surgical site. Pt pre-medicated for OT session and RN notified. Pt repositioned in bed following therapy session. SPO2 levels stable throughout session without use of O2 (nasal cannula).      Hand Dominance Right   Extremity/Trunk Assessment Upper Extremity Assessment Upper Extremity Assessment: Overall WFL for tasks assessed   Lower Extremity Assessment Lower Extremity Assessment: Defer to PT evaluation   Cervical / Trunk Assessment Cervical / Trunk Assessment: Normal   Communication Communication Communication: No difficulties   Cognition Arousal/Alertness: Awake/alert Behavior During Therapy: WFL for tasks assessed/performed Overall Cognitive Status: Within Functional Limits for tasks assessed  Home Living Family/patient expects to be discharged to:: Private residence Living Arrangements: Parent Available Help at Discharge: Family;Available 24 hours/day Type of  Home: House Home Access: Stairs to enter Entergy CorporationEntrance Stairs-Number of Steps: 5 Entrance Stairs-Rails: None Home Layout: Two level;Able to live on main level with bedroom/bathroom     Bathroom Shower/Tub: Producer, television/film/videoWalk-in shower   Bathroom Toilet: Standard     Home Equipment: Environmental consultantWalker - 2 wheels          Prior Functioning/Environment Level of Independence: Independent        Comments: pt with hx of knee surgeries and familiar with compensatory techniques                              End of Session Equipment Utilized During Treatment: Other (comment) (cructhes) CPM Left Knee CPM Left Knee: Off Nurse Communication: Other (comment) (IV pole cord hooked through bed limiting mobility; nausea)  Activity Tolerance: Patient tolerated treatment well Patient left: in bed;with call bell/phone within reach;with family/visitor present   Time: [id number] OT Time Calculation (min): 20 min Charges:  OT General Charges $OT Visit: 1 Procedure OT Evaluation $Initial OT Evaluation Tier I: 1 Procedure OT Treatments $Self Care/Home Management : 8-22 mins G-Codes: OT G-codes **NOT FOR INPATIENT CLASS** Functional Assessment Tool Used: clinical judgement Functional Limitation: Self care Self Care Current Status (V4098(G8987): At least 1 percent but less than 20 percent impaired, limited or restricted Self Care Goal Status ([id number](G8988): At least 1 percent but less than 20 percent impaired, limited or restricted Self Care Discharge Status [id number]([id number]): At least 1 percent but less than 20 percent impaired, limited or restricted   Rae LipsMiller, Schuyler Behan M [phone] 10/28/2013, 10:24 AM

## 2013-10-28 NOTE — Progress Notes (Signed)
Orthopedic Tech Progress Note Patient Details:  Shane Warren 06/16/1990 [id number]  Ortho Devices Type of Ortho Device: Crutches Ortho Device/Splint Interventions: Ordered   Asia Burnett KanarisR Thompson 10/28/2013, 10:19 AM

## 2013-10-28 NOTE — Progress Notes (Signed)
Pt discharged to home. D/c instructions given, no questions verbalized. Vitals stable. 

## 2016-03-08 IMAGING — RF DG KNEE 3 VIEWS*L*
1 series · 2 of 2 positions shown · non-contrast
Comparison: 10/04/2013

CLINICAL DATA: Left Knee Surgery

EXAM:
DG C-ARM 61-120 MIN; LEFT KNEE - 3 VIEW

[Series 1: run · 2 of 2 slices shown]
[im 1/2]
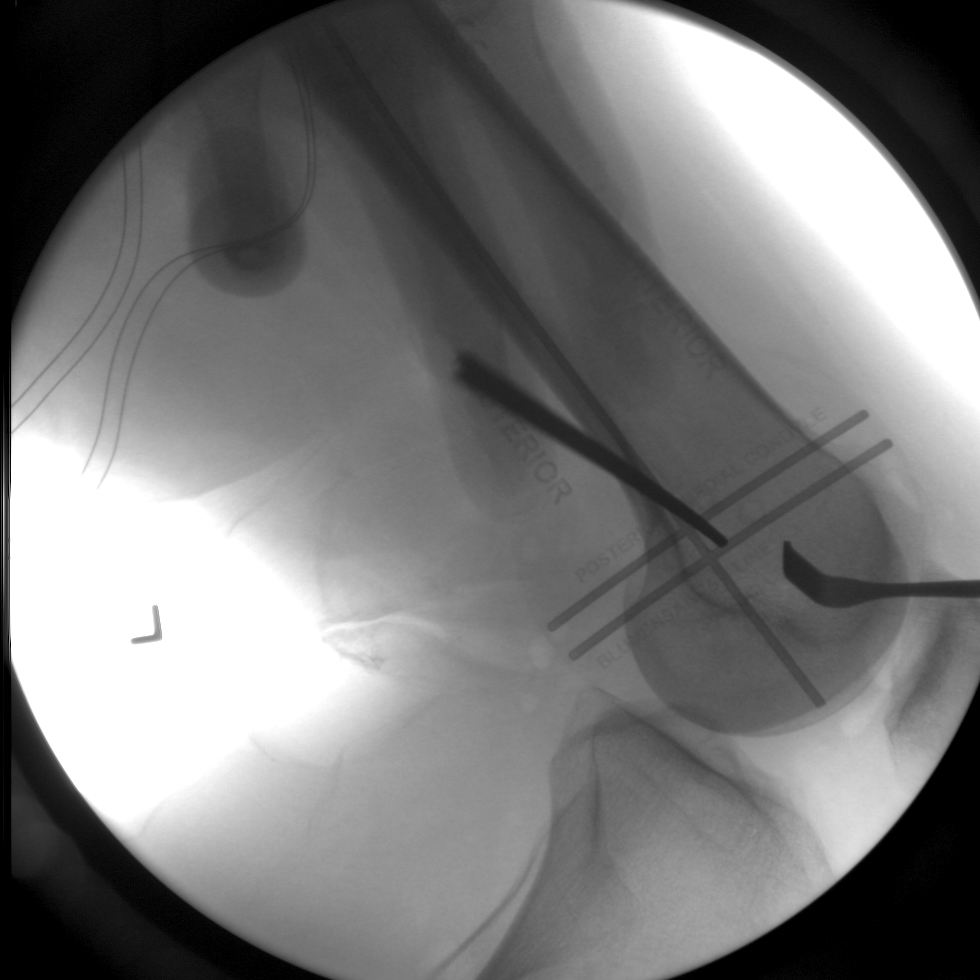
[im 2/2]
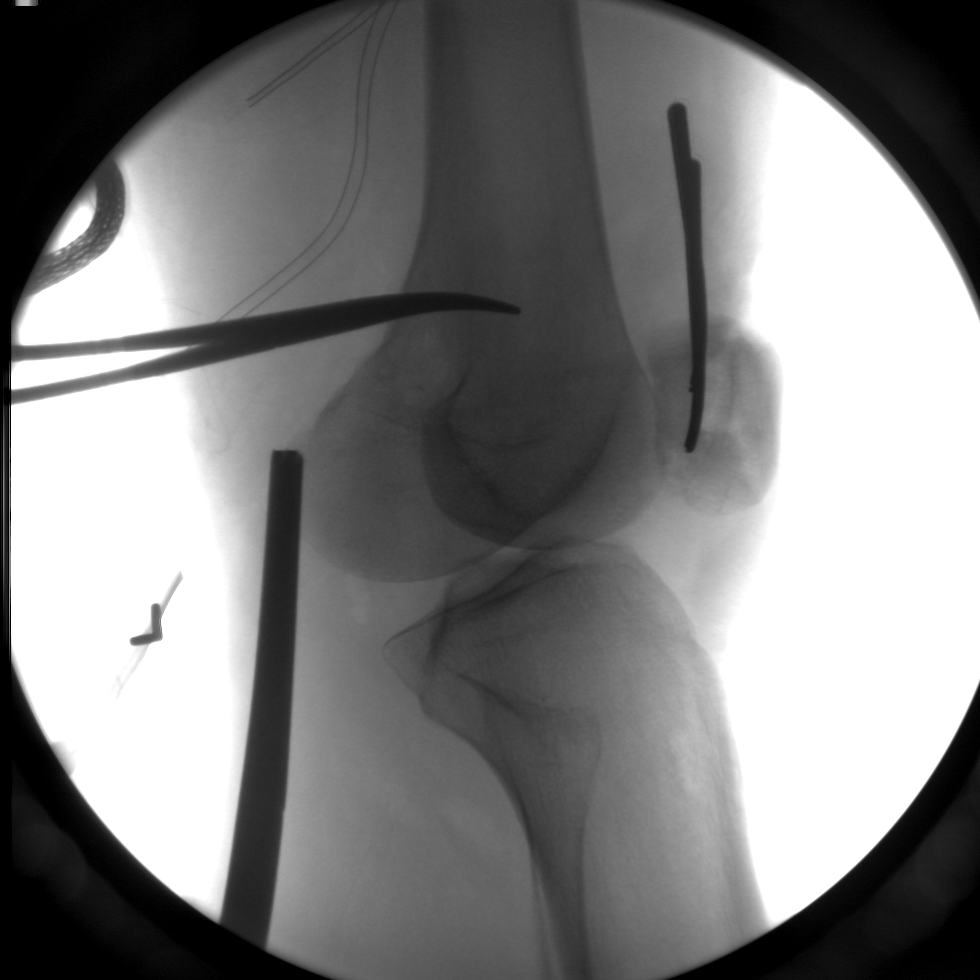

[2 of 2 positions shown; findings below may reference images not displayed]

FINDINGS: Images demonstrate two views of the left knee with instrumentation
in place.
IMPRESSION: Intraoperative imaging for knee surgery.
